# Patient Record
Sex: Male | Born: 1940 | Race: Black or African American | Hispanic: No | Marital: Married | Smoking: Never smoker
Health system: Southern US, Community
[De-identification: ages and names within clinical notes are randomized; demographics above are authoritative.]

## PROBLEM LIST (undated history)

## (undated) DIAGNOSIS — K219 Gastro-esophageal reflux disease without esophagitis: Secondary | ICD-10-CM

## (undated) DIAGNOSIS — I4891 Unspecified atrial fibrillation: Secondary | ICD-10-CM

## (undated) HISTORY — PX: BACK SURGERY: SHX140

## (undated) HISTORY — PX: TOTAL HIP ARTHROPLASTY: SHX124

## (undated) HISTORY — PX: OTHER SURGICAL HISTORY: SHX169

---

## 2014-06-20 ENCOUNTER — Encounter (HOSPITAL_BASED_OUTPATIENT_CLINIC_OR_DEPARTMENT_OTHER): Payer: Self-pay | Admitting: *Deleted

## 2014-06-20 ENCOUNTER — Emergency Department (HOSPITAL_BASED_OUTPATIENT_CLINIC_OR_DEPARTMENT_OTHER)
Admission: EM | Admit: 2014-06-20 | Discharge: 2014-06-20 | Disposition: A | Discharge status: Home / Self Care | Payer: Medicare Other | Attending: Emergency Medicine | Admitting: Emergency Medicine

## 2014-06-20 ENCOUNTER — Emergency Department (HOSPITAL_BASED_OUTPATIENT_CLINIC_OR_DEPARTMENT_OTHER): Payer: Medicare Other

## 2014-06-20 DIAGNOSIS — Z8781 Personal history of (healed) traumatic fracture: Secondary | ICD-10-CM | POA: Diagnosis not present

## 2014-06-20 DIAGNOSIS — Z7902 Long term (current) use of antithrombotics/antiplatelets: Secondary | ICD-10-CM | POA: Diagnosis not present

## 2014-06-20 DIAGNOSIS — Z79899 Other long term (current) drug therapy: Secondary | ICD-10-CM | POA: Diagnosis not present

## 2014-06-20 DIAGNOSIS — J069 Acute upper respiratory infection, unspecified: Secondary | ICD-10-CM | POA: Insufficient documentation

## 2014-06-20 DIAGNOSIS — K219 Gastro-esophageal reflux disease without esophagitis: Secondary | ICD-10-CM | POA: Insufficient documentation

## 2014-06-20 DIAGNOSIS — R05 Cough: Secondary | ICD-10-CM | POA: Diagnosis present

## 2014-06-20 HISTORY — DX: Unspecified atrial fibrillation: I48.91

## 2014-06-20 HISTORY — DX: Gastro-esophageal reflux disease without esophagitis: K21.9

## 2014-06-20 MED ORDER — BENZONATATE 100 MG PO CAPS: 100.0000 mg | ORAL_CAPSULE | Freq: Three times a day (TID) | ORAL | Status: AC

## 2014-06-20 NOTE — ED Notes (Signed)
MD at bedside. 

## 2014-06-20 NOTE — ED Notes (Signed)
Pt c/o cough that is slightly productive of light brown sputum. Pt says he has had cold symptoms x2 weeks and thought it was resolving but the cough is now getting worse.

## 2014-06-20 NOTE — ED Provider Notes (Signed)
CSN: [id number]     Arrival date & time 06/20/14  1405 History   First MD Initiated Contact with Patient 06/20/14 1420     Chief Complaint  Patient presents with  . Cough   HPI Patient presents to the emergency room with complaints of cough the past 2 weeks.  He thought he was getting better after about a week or so but in the last week the symptoms started back again. He has been bringing up mucus and light brown sputum.  He denies any trouble with fevers or shortness of breath. He has not had any chest pain or leg swelling.  He called his primary doctor who called him in azithromycin. He started taking that a few days ago but has not felt much better. Past Medical History  Diagnosis Date  . A-fib   . GERD (gastroesophageal reflux disease)    Past Surgical History  Procedure Laterality Date  . Back surgery    . Total hip arthroplasty    . Leg fracture repair     No family history on file. History  Substance Use Topics  . Smoking status: Never Smoker   . Smokeless tobacco: Not on file  . Alcohol Use: No    Review of Systems  All other systems reviewed and are negative.     Allergies  Review of patient's allergies indicates no known allergies.  Home Medications   Prior to Admission medications   Medication Sig Start Date End Date Taking? Authorizing Provider  apixaban (ELIQUIS) 5 MG TABS tablet Take 5 mg by mouth 2 (two) times daily.   Yes Historical Provider, MD  enalapril (VASOTEC) 5 MG tablet Take 5 mg by mouth daily.   Yes Historical Provider, MD  metoprolol succinate (TOPROL-XL) 50 MG 24 hr tablet Take 50 mg by mouth daily. Take with or immediately following a meal.   Yes Historical Provider, MD  omeprazole (PRILOSEC) 10 MG capsule Take 10 mg by mouth daily.   Yes Historical Provider, MD  tamsulosin (FLOMAX) 0.4 MG CAPS capsule Take 0.4 mg by mouth.   Yes Historical Provider, MD  traMADol (ULTRAM) 50 MG tablet Take by mouth every 6 (six) hours as needed.   Yes  Historical Provider, MD   BP 138/69 mmHg  Pulse 67  Temp(Src) 97.9 F (36.6 C) (Oral)  Resp 18  Ht 6' (1.829 m)  Wt 164 lb (74.39 kg)  BMI 22.24 kg/m2  SpO2 98% Physical Exam  Constitutional: He appears well-developed and well-nourished. No distress.  HENT:  Head: Normocephalic and atraumatic.  Right Ear: External ear normal.  Left Ear: External ear normal.  Eyes: Conjunctivae are normal. Right eye exhibits no discharge. Left eye exhibits no discharge. No scleral icterus.  Neck: Neck supple. No tracheal deviation present.  Cardiovascular: Normal rate, regular rhythm and intact distal pulses.   Pulmonary/Chest: Effort normal and breath sounds normal. No stridor. No respiratory distress. He has no wheezes. He has no rales.  Abdominal: Soft. Bowel sounds are normal. He exhibits no distension. There is no tenderness. There is no rebound and no guarding.  Musculoskeletal: He exhibits no edema or tenderness.  Neurological: He is alert. He has normal strength. No cranial nerve deficit (no facial droop, extraocular movements intact, no slurred speech) or sensory deficit. He exhibits normal muscle tone. He displays no seizure activity. Coordination normal.  Skin: Skin is warm and dry. No rash noted.  Psychiatric: He has a normal mood and affect.  Nursing note and vitals reviewed.  ED Course  Procedures (including critical care time) Labs Review Labs Reviewed - No data to display  Imaging Review CXR negative    MDM   Final diagnoses:  URI, acute    No pna.  Pt without dypnea or fever.  Most likely viral illness.  Continue supportive care.    Linwood DibblesJon Oyuki Hogan, MD 06/24/14 [phone]

## 2014-06-20 NOTE — Discharge Instructions (Signed)
Cough, Adult   A cough is a reflex. It helps you clear your throat and airways. A cough can help heal your body. A cough can last 2 or 3 weeks (acute) or may last more than 8 weeks (chronic). Some common causes of a cough can include an infection, allergy, or a cold.  HOME CARE  · Only take medicine as told by your doctor.  · If given, take your medicines (antibiotics) as told. Finish them even if you start to feel better.  · Use a cold steam vaporizer or humidifier in your home. This can help loosen thick spit (secretions).  · Sleep so you are almost sitting up (semi-upright). Use pillows to do this. This helps reduce coughing.  · Rest as needed.  · Stop smoking if you smoke.  GET HELP RIGHT AWAY IF:  · You have yellowish-white fluid (pus) in your thick spit.  · Your cough gets worse.  · Your medicine does not reduce coughing, and you are losing sleep.  · You cough up blood.  · You have trouble breathing.  · Your pain gets worse and medicine does not help.  · You have a fever.  MAKE SURE YOU:   · Understand these instructions.  · Will watch your condition.  · Will get help right away if you are not doing well or get worse.  Document Released: 01/14/2011 Document Revised: 09/17/2013 Document Reviewed: 01/14/2011  [employer]® Patient Information ©2015 [employer]. This information is not intended to replace advice given to you by your health care provider. Make sure you discuss any questions you have with your health care provider.

## 2016-02-08 IMAGING — CR DG CHEST 2V
2 series · 2 of 2 positions shown · non-contrast
Comparison: None.

CLINICAL DATA: Two week history of cough

EXAM:
CHEST  2 VIEW

[w chest pa]
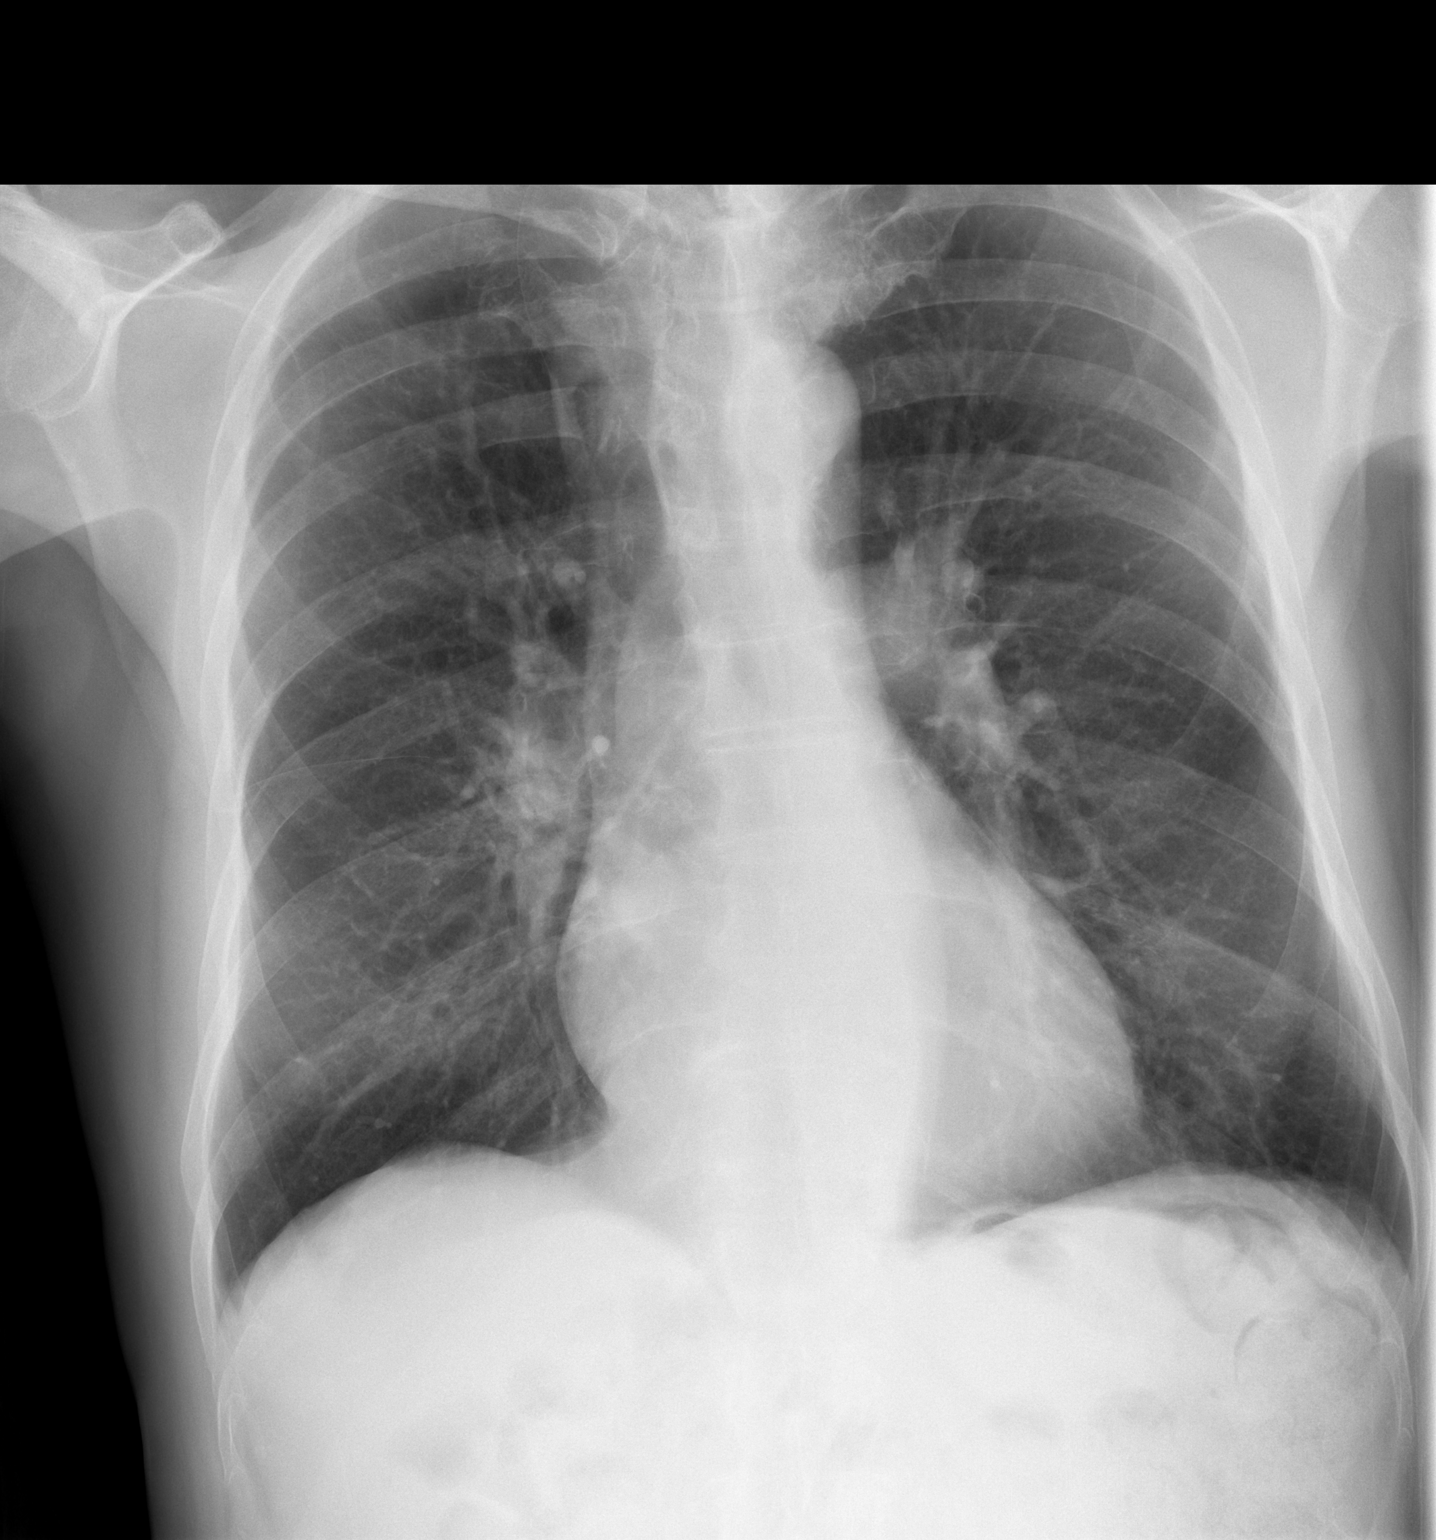

[w chest lat]
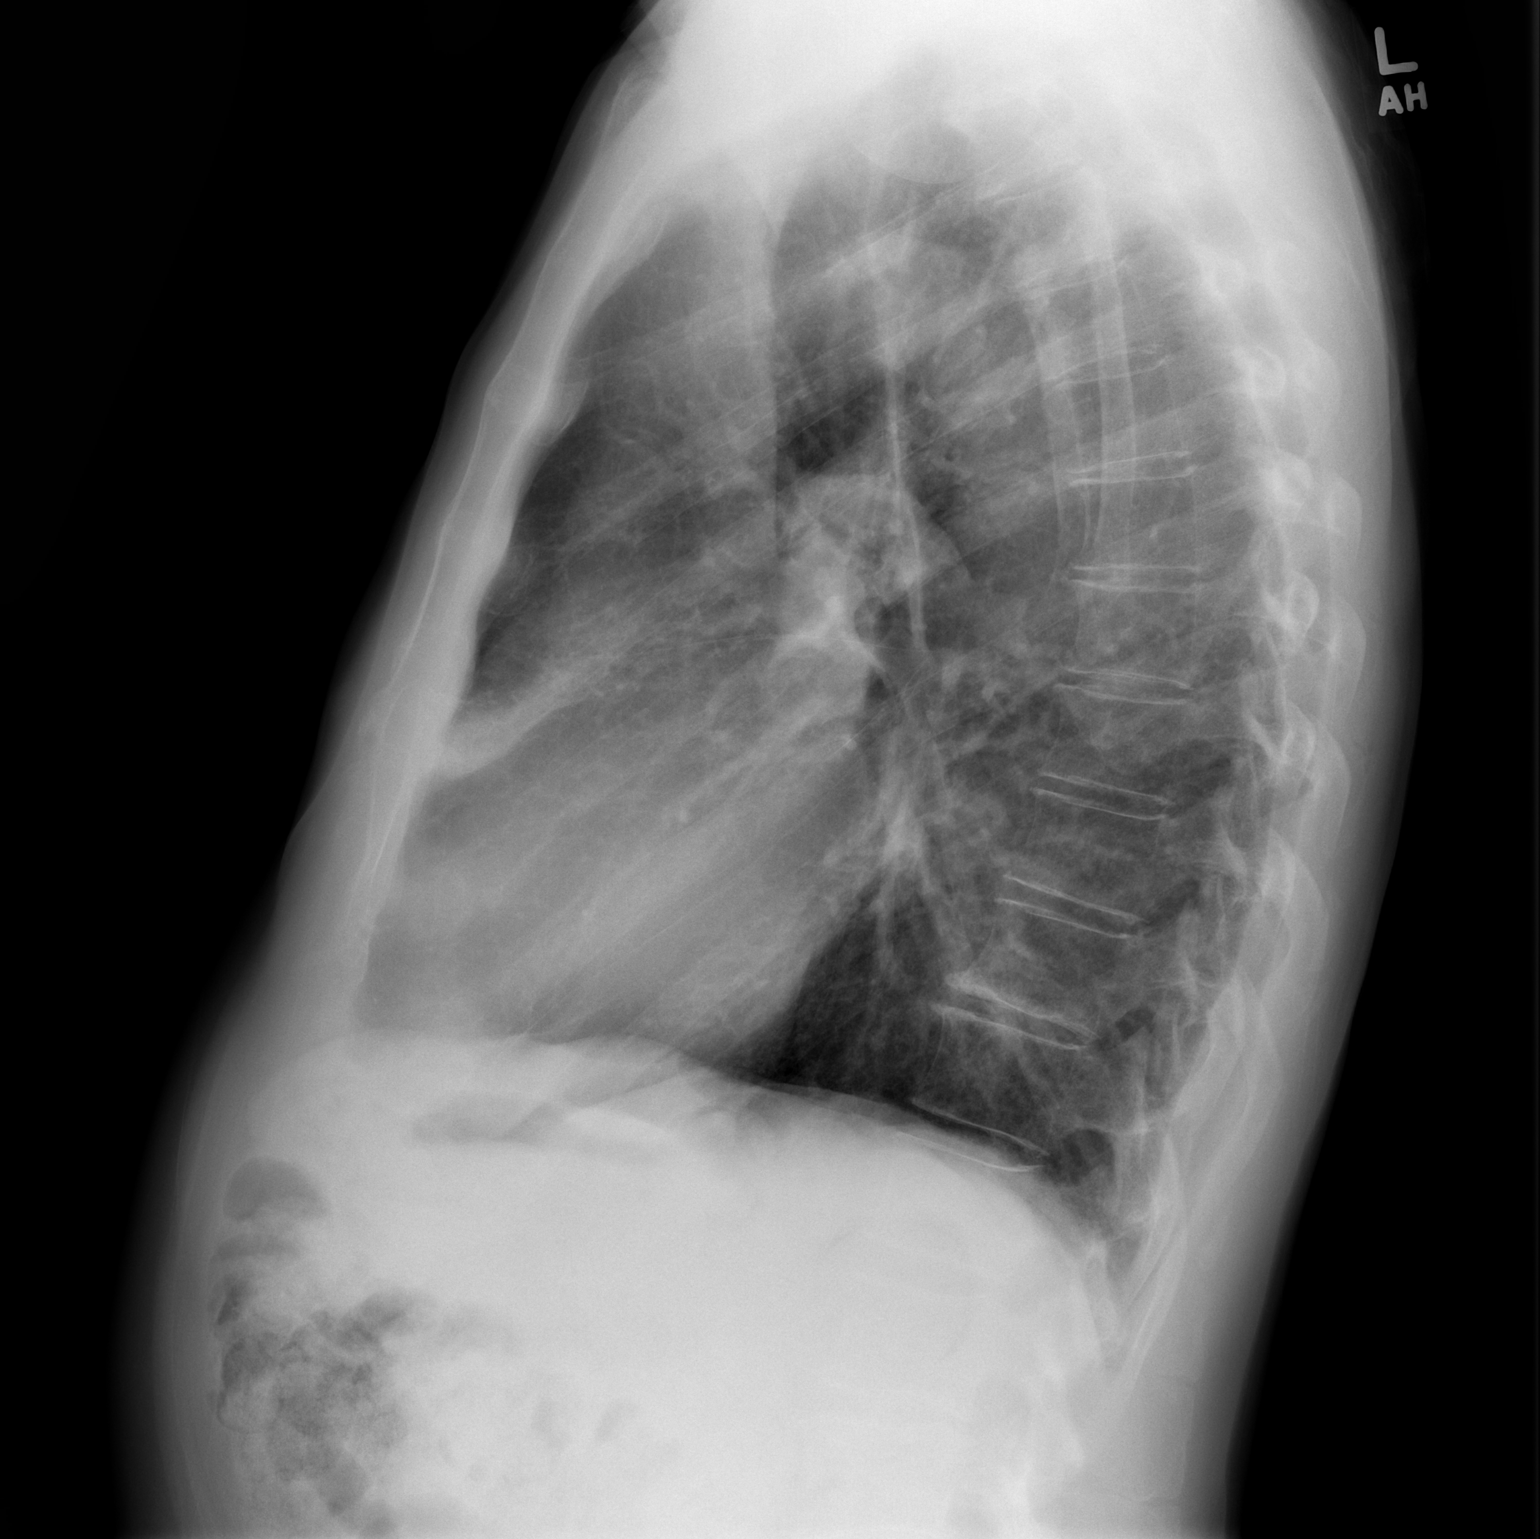

[2 of 2 positions shown; findings below may reference images not displayed]

FINDINGS: There is no edema or consolidation. The heart size and pulmonary
vascularity are normal. No adenopathy. Incidental note is made of an
azygos lobe on the right. No bone lesions.
IMPRESSION: No edema or consolidation.
